# Patient Record
Sex: Male | Born: 2000 | Hispanic: Yes | Marital: Single | Smoking: Never smoker
Health system: Southern US, Community
[De-identification: ages and names within clinical notes are randomized; demographics above are authoritative.]

## PROBLEM LIST (undated history)

## (undated) DIAGNOSIS — J45909 Unspecified asthma, uncomplicated: Secondary | ICD-10-CM

---
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
## 2021-08-05 ENCOUNTER — Encounter: Payer: Self-pay | Admitting: Emergency Medicine

## 2021-08-05 ENCOUNTER — Ambulatory Visit
Admission: EM | Admit: 2021-08-05 | Discharge: 2021-08-05 | Disposition: A | Discharge status: Home / Self Care | Payer: BC Managed Care – PPO | Attending: Student | Admitting: Student

## 2021-08-05 ENCOUNTER — Other Ambulatory Visit: Payer: Self-pay

## 2021-08-05 DIAGNOSIS — L600 Ingrowing nail: Secondary | ICD-10-CM | POA: Diagnosis not present

## 2021-08-05 DIAGNOSIS — B351 Tinea unguium: Secondary | ICD-10-CM | POA: Diagnosis not present

## 2021-08-05 MED ORDER — CEPHALEXIN 500 MG PO CAPS: 500.0000 mg | ORAL_CAPSULE | Freq: Four times a day (QID) | ORAL | 0 refills | Status: AC

## 2021-08-05 NOTE — ED Triage Notes (Signed)
Pt here for pain and discoloration to bilateral great toe nails x months

## 2021-08-05 NOTE — ED Provider Notes (Signed)
[facility]    CSN: [id number] Arrival date & time: 08/05/21  0806      History   Chief Complaint Chief Complaint  Patient presents with   Nail Problem    HPI Micheal Massey is a 21 y.o. male presenting with ingrown toenails.  History noncontributory.  Here today with sister.  The patient states that he has had toenail pain for years, along with discoloration.  The pain is gotten worse over the last 3 months, with some purulent and bloody drainage.  He has not attempted any interventions.  States that standing at work all day makes it worse.  HPI  Past Medical History:  Diagnosis Date   Asthma     There are no problems to display for this patient.   History reviewed. No pertinent surgical history.     Home Medications    Prior to Admission medications   Medication Sig Start Date End Date Taking? Authorizing Provider  cephALEXin (KEFLEX) 500 MG capsule Take 1 capsule (500 mg total) by mouth 4 (four) times daily. 08/05/21  Yes Hazel Sams, PA-C    Family History History reviewed. No pertinent family history.  Social History Social History   Tobacco Use   Smoking status: Never     Allergies   Patient has no known allergies.   Review of Systems Review of Systems  Skin:  Positive for color change.  All other systems reviewed and are negative.   Physical Exam Triage Vital Signs ED Triage Vitals  Enc Vitals Group     BP 08/05/21 0859 (!) 149/95     Pulse Rate 08/05/21 0859 86     Resp 08/05/21 0859 18     Temp 08/05/21 0859 98.2 F (36.8 C)     Temp Source 08/05/21 0859 Oral     SpO2 08/05/21 0859 98 %     Weight --      Height --      Head Circumference --      Peak Flow --      Pain Score 08/05/21 0900 5     Pain Loc --      Pain Edu? --      Excl. in [place]? --    No data found.  Updated Vital Signs BP (!) 149/95 (BP Location: Left Arm)   Pulse 86   Temp 98.2 F (36.8 C) (Oral)   Resp 18   SpO2 98%   Visual  Acuity Right Eye Distance:   Left Eye Distance:   Bilateral Distance:    Right Eye Near:   Left Eye Near:    Bilateral Near:     Physical Exam Vitals reviewed.  Constitutional:      General: He is not in acute distress.    Appearance: Normal appearance. He is not ill-appearing.  HENT:     Head: Normocephalic and atraumatic.  Pulmonary:     Effort: Pulmonary effort is normal.  Skin:    Comments: See image below Bilateral great toenails are hypertrophic and discolored. They are both ingrown over the medial aspect with some discomfort and swelling or the medial nail fold.   Neurological:     General: No focal deficit present.     Mental Status: He is alert and oriented to person, place, and time.  Psychiatric:        Mood and Affect: Mood normal.        Behavior: Behavior normal.        Thought Content:  Thought content normal.        Judgment: Judgment normal.      UC Treatments / Results  Labs (all labs ordered are listed, but only abnormal results are displayed) Labs Reviewed - No data to display  EKG   Radiology No results found.  Procedures Procedures (including critical care time)  Medications Ordered in UC Medications - No data to display  Initial Impression / Assessment and Plan / UC Course  I have reviewed the triage vital signs and the nursing notes.  Pertinent labs & imaging results that were available during my care of the patient were reviewed by me and considered in my medical decision making (see chart for details).     This patient is a very pleasant 21 y.o. year old male presenting with infected and ingrown great toenails. The nails are also hypertrophic due to onychomycosis; this has been present for years per pt. Given the extent of the infection and onychomycosis, I strongly advised podiatry f/u ASAP; resources provided. I did start him on keflex today and provide a work note, and he can also try epsoam salt baths at home. ED return precautions  discussed. Patient verbalizes understanding and agreement.     Final Clinical Impressions(s) / UC Diagnoses   Final diagnoses:  Ingrown toenail with infection  Toenail fungus     Discharge Instructions      -Start the antibiotic: Keflex, 4x daily x5 days. You can take this with food if you have a sensitive stomach. -Soak nails in warm water or epsoam salt baths -Call podiatry today to schedule a follow-up. You'll need to have both the fungus and ingrown nails treated, which can take interventions that I can't perform at urgent care.    ED Prescriptions     Medication Sig Dispense Auth. Provider   cephALEXin (KEFLEX) 500 MG capsule Take 1 capsule (500 mg total) by mouth 4 (four) times daily. 20 capsule Hazel Sams, PA-C      PDMP not reviewed this encounter.   Hazel Sams, PA-C 08/05/21 [phone]

## 2021-08-05 NOTE — Discharge Instructions (Signed)
-  Start the antibiotic: Keflex, 4x daily x5 days. You can take this with food if you have a sensitive stomach. -Soak nails in warm water or epsoam salt baths -Call podiatry today to schedule a follow-up. You'll need to have both the fungus and ingrown nails treated, which can take interventions that I can't perform at urgent care.
